# Patient Record
Sex: Female | Born: 1990 | Race: Black or African American | Hispanic: No | Marital: Single | Smoking: Former smoker
Health system: Southern US, Community
[De-identification: ages and names within clinical notes are randomized; demographics above are authoritative.]

## PROBLEM LIST (undated history)

## (undated) DIAGNOSIS — D573 Sickle-cell trait: Secondary | ICD-10-CM

## (undated) HISTORY — DX: Morbid (severe) obesity due to excess calories: E66.01

## (undated) HISTORY — DX: Sickle-cell trait: D57.3

---
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
## 2012-05-21 ENCOUNTER — Encounter: Payer: Medicaid Other | Admitting: Obstetrics and Gynecology

## 2012-06-18 ENCOUNTER — Encounter: Payer: Self-pay | Admitting: Obstetrics and Gynecology

## 2012-07-16 ENCOUNTER — Encounter: Payer: Self-pay | Admitting: Obstetrics and Gynecology

## 2012-08-08 ENCOUNTER — Ambulatory Visit (INDEPENDENT_AMBULATORY_CARE_PROVIDER_SITE_OTHER): Payer: Medicaid Other | Admitting: Obstetrics & Gynecology

## 2012-08-08 ENCOUNTER — Encounter: Payer: Self-pay | Admitting: Obstetrics & Gynecology

## 2012-08-08 VITALS — BP 124/88 | HR 86 | Temp 98.0°F | Ht 66.0 in | Wt 324.6 lb

## 2012-08-08 DIAGNOSIS — E282 Polycystic ovarian syndrome: Secondary | ICD-10-CM

## 2012-08-08 LAB — POCT PREGNANCY, URINE: Preg Test, Ur: NEGATIVE

## 2012-08-08 NOTE — Progress Notes (Signed)
  Subjective:    Patient ID: Kelsey Mathis, female    DOB: Feb 28, 1991, 22 y.o.   MRN: [id number]  HPI 22 yo S AA [id number] who is here today in the gyn clinic because of irregular bleeding, almost daily for the last month. She says that periods have always been irregular. Menarche at about 22 yo. In the year of 2013 she had no periods at all. [facility] and was given provera to induce a period. In 2013 she did take OCPs for about 3 months.    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 She is moving to [place]. She has been monogamous with her boyfriend for 4 years. She had 1 of the 3 Gardasils. She is not interested in restarting the Gardasil.  She is having unprotected sex and is ok with a pregnancy. Objective:   Physical Exam        Assessment & Plan:  Probable PCOS causing DUB/oligomenorrhea. I will get fasting lipid panel, cmeta, gyn u/s, test.. I have explained that weight loss will improve her general well being.

## 2012-08-15 ENCOUNTER — Ambulatory Visit (HOSPITAL_COMMUNITY)
Admission: RE | Admit: 2012-08-15 | Discharge: 2012-08-15 | Disposition: A | Discharge status: Home / Self Care | Payer: Self-pay | Source: Ambulatory Visit | Attending: Obstetrics & Gynecology | Admitting: Obstetrics & Gynecology

## 2012-08-15 DIAGNOSIS — N926 Irregular menstruation, unspecified: Secondary | ICD-10-CM | POA: Insufficient documentation

## 2012-08-15 DIAGNOSIS — N938 Other specified abnormal uterine and vaginal bleeding: Secondary | ICD-10-CM | POA: Insufficient documentation

## 2012-08-15 DIAGNOSIS — E282 Polycystic ovarian syndrome: Secondary | ICD-10-CM | POA: Insufficient documentation

## 2012-08-15 DIAGNOSIS — N949 Unspecified condition associated with female genital organs and menstrual cycle: Secondary | ICD-10-CM | POA: Insufficient documentation

## 2012-09-18 ENCOUNTER — Telehealth: Payer: Self-pay | Admitting: *Deleted

## 2012-09-18 NOTE — Telephone Encounter (Signed)
Pt left message requesting test results.  

## 2012-09-25 NOTE — Telephone Encounter (Signed)
Called pt and went straight to VM left message to return call to the clinics.

## 2012-09-26 ENCOUNTER — Telehealth: Payer: Self-pay | Admitting: *Deleted

## 2012-09-26 NOTE — Telephone Encounter (Signed)
Called patient on mobile, went straight to VM. Called patient at home number, no one answered- left message to call us back at the clinics

## 2012-09-26 NOTE — Telephone Encounter (Signed)
Kelsey Mathis called and left a message that was difficult to understand.  States she has been a patient for 3 months and needs results of old doctor visit because problem is still going on.

## 2012-09-26 NOTE — Telephone Encounter (Signed)
Called pt and pt informed me that she had been seen about a month ago and no one has told her anything.  Looking at pt's chart Dr. Marice Potter wanted her to get fasting lipid panel in which she did not get done and I advised her to call tomorrow to schedule a follow up appt and to either schedule lab draw and provider appt on same day or just lab draw then provider appt separate to make sure that she is fasting starting midnight the night before her appt for the lab.  Pt stated ok and did not have any other questions.

## 2012-09-28 NOTE — Telephone Encounter (Signed)
Reviewed chart and patient had recently been contacted inquiring about a lab visit for a fasting lipid panel and provider visit. Called patient to verify. Patient stated she wanted the lab appt and the provider visit to be the same day. Told patient I would give our front office staff her information and they will contact her sometime early next week to schedule an appt. Patient verbalized understanding and had no further questions

## 2012-10-02 ENCOUNTER — Encounter: Payer: Self-pay | Admitting: Obstetrics & Gynecology

## 2012-11-09 ENCOUNTER — Ambulatory Visit: Payer: Self-pay | Admitting: Obstetrics & Gynecology

## 2012-11-22 ENCOUNTER — Encounter: Payer: Self-pay | Admitting: Obstetrics & Gynecology

## 2012-11-22 ENCOUNTER — Ambulatory Visit (INDEPENDENT_AMBULATORY_CARE_PROVIDER_SITE_OTHER): Payer: Self-pay | Admitting: Obstetrics & Gynecology

## 2012-11-22 VITALS — BP 113/73 | HR 109 | Temp 98.5°F | Ht 66.75 in | Wt 327.4 lb

## 2012-11-22 DIAGNOSIS — E282 Polycystic ovarian syndrome: Secondary | ICD-10-CM

## 2012-11-22 MED ORDER — NORETHIN-ETH ESTRADIOL-FE 0.4-35 MG-MCG PO CHEW: 1.0000 | CHEWABLE_TABLET | Freq: Every day | ORAL | Status: AC

## 2012-11-22 MED ORDER — METFORMIN HCL 500 MG PO TABS: 500.0000 mg | ORAL_TABLET | Freq: Two times a day (BID) | ORAL | Status: AC

## 2012-11-22 NOTE — Patient Instructions (Addendum)
Polycystic Ovarian Syndrome  Polycystic ovarian syndrome is a condition with a number of problems. One problem is with the ovaries. The ovaries are organs located in the female pelvis, on each side of the uterus. Usually, during the menstrual cycle, an egg is released from 1 ovary every month. This is called ovulation. When the egg is fertilized, it goes into the womb (uterus), which allows for the growth of a baby. The egg travels from the ovary through the fallopian tube to the uterus. The ovaries also make the hormones estrogen and progesterone. These hormones help the development of a woman's breasts, body shape, and body hair. They also regulate the menstrual cycle and pregnancy.  Sometimes, cysts form in the ovaries. A cyst is a fluid-filled sac. On the ovary, different types of cysts can form. The most common type of ovarian cyst is called a functional or ovulation cyst. It is normal, and often forms during the normal menstrual cycle. Each month, a woman's ovaries grow tiny cysts that hold the eggs. When an egg is fully grown, the sac breaks open. This releases the egg. Then, the sac which released the egg from the ovary dissolves. In one type of functional cyst, called a follicle cyst, the sac does not break open to release the egg. It may actually continue to grow. This type of cyst usually disappears within 1 to 3 months.   One type of cyst problem with the ovaries is called Polycystic Ovarian Syndrome (PCOS). In this condition, many follicle cysts form, but do not rupture and produce an egg. This health problem can affect the following:  · Menstrual cycle.  · Heart.  · Obesity.  · Cancer of the uterus.  · Fertility.  · Blood vessels.  · Hair growth (face and body) or baldness.  · Hormones.  · Appearance.  · High blood pressure.  · Stroke.  · Insulin production.  · Inflammation of the liver.  · Elevated blood cholesterol and triglycerides.  CAUSES   · No one knows the exact cause of PCOS.  · Women with  PCOS often have a mother or sister with PCOS. There is not yet enough proof to say this is inherited.  · Many women with PCOS have a weight problem.  · Researchers are looking at the relationship between PCOS and the body's ability to make insulin. Insulin is a hormone that regulates the change of sugar, starches, and other food into energy for the body's use, or for storage. Some women with PCOS make too much insulin. It is possible that the ovaries react by making too many female hormones, called androgens. This can lead to acne, excessive hair growth, weight gain, and ovulation problems.  · Too much production of luteinizing hormone (LH) from the pituitary gland in the brain stimulates the ovary to produce too much female hormone (androgen).  SYMPTOMS   · Infrequent or no menstrual periods, and/or irregular bleeding.  · Inability to get pregnant (infertility), because of not ovulating.  · Increased growth of hair on the face, chest, stomach, back, thumbs, thighs, or toes.  · Acne, oily skin, or dandruff.  · Pelvic pain.  · Weight gain or obesity, usually carrying extra weight around the waist.  · Type 2 diabetes (this is the diabetes that usually does not need insulin).  · High cholesterol.  · High blood pressure.  · Female-pattern baldness or thinning hair.  · Patches of thickened and dark brown or black skin on the neck, arms, breasts,   or thighs.  · Skin tags, or tiny excess flaps of skin, in the armpits or neck area.  · Sleep apnea (excessive snoring and breathing stops at times while asleep).  · Deepening of the voice.  · Gestational diabetes when pregnant.  · Increased risk of miscarriage with pregnancy.  DIAGNOSIS   There is no single test to diagnose PCOS.   · Your caregiver will:  · Take a medical history.  · Perform a pelvic exam.  · Perform an ultrasound.  · Check your female and female hormone levels.  · Measure glucose or sugar levels in the blood.  · Do other blood tests.  · If you are producing too many  female hormones, your caregiver will make sure it is from PCOS. At the physical exam, your caregiver will want to evaluate the areas of increased hair growth. Try to allow natural hair growth for a few days before the visit.  · During a pelvic exam, the ovaries may be enlarged or swollen by the increased number of small cysts. This can be seen more easily by vaginal ultrasound or screening, to examine the ovaries and lining of the uterus (endometrium) for cysts. The uterine lining may become thicker, if there has not been a regular period.  TREATMENT   Because there is no cure for PCOS, it needs to be managed to prevent problems. Treatments are based on your symptoms. Treatment is also based on whether you want to have a baby or whether you need contraception.   Treatment may include:  · Progesterone hormone, to start a menstrual period.  · Birth control pills, to make you have regular menstrual periods.  · Medicines to make you ovulate, if you want to get pregnant.  · Medicines to control your insulin.  · Medicine to control your blood pressure.  · Medicine and diet, to control your high cholesterol and triglycerides in your blood.  · Surgery, making small holes in the ovary, to decrease the amount of female hormone production. This is done through a long, lighted tube (laparoscope), placed into the pelvis through a tiny incision in the lower abdomen.  Your caregiver will go over some of the choices with you.  WOMEN WITH PCOS HAVE THESE CHARACTERISTICS:  · High levels of female hormones called androgens.  · An irregular or no menstrual cycle.  · May have many small cysts in their ovaries.  PCOS is the most common hormonal reproductive problem in women of childbearing age.  WHY DO WOMEN WITH PCOS HAVE TROUBLE WITH THEIR MENSTRUAL CYCLE?  Each month, about 20 eggs start to mature in the ovaries. As one egg grows and matures, the follicle breaks open to release the egg, so it can travel through the fallopian tube for  fertilization. When the single egg leaves the follicle, ovulation takes place. In women with PCOS, the ovary does not make all of the hormones it needs for any of the eggs to fully mature. They may start to grow and accumulate fluid, but no one egg becomes large enough. Instead, some may remain as cysts. Since no egg matures or is released, ovulation does not occur and the hormone progesterone is not made. Without progesterone, a woman's menstrual cycle is irregular or absent. Also, the cysts produce female hormones, which continue to prevent ovulation.   Document Released: 06/17/2004 Document Revised: 05/16/2011 Document Reviewed: 01/09/2009  [employer]® Patient Information ©2014 [employer].

## 2012-11-22 NOTE — Progress Notes (Signed)
States was bleeding on and off for 6 months. Then bleeding everyday- sometimes heavy sometimes light for 3 months

## 2012-11-22 NOTE — Progress Notes (Signed)
Subjective:     Patient ID: Kelsey Mathis, female   DOB: Aug 19, 1990, 22 y.o.   MRN: [id number]  HPI  Pt reports that she has had irreg bleeding since menarche.  She has ceased bleeding now but, was just recently bleeding for 15days.  She did not f/u to get the labs that were ordered when she was here prev.  She reports that she is too busy to exercise.     Review of Systems     Objective:   Physical Exam BP 113/73  Pulse 109  Temp(Src) 98.5 F (36.9 C)  Ht 5' 6.75" (1.695 m)  Wt 327 lb 6.4 oz (148.508 kg)  BMI 51.69 kg/m2  LMP 08/22/2012 Pt in NAD  Exam deferred   08/15/2012 Clinical Data: Dysfunctional uterine bleeding. Irregular menses.  Polycystic ovary syndrome. Uncertain LMP.  TRANSABDOMINAL AND TRANSVAGINAL ULTRASOUND OF PELVIS  Technique: Both transabdominal and transvaginal ultrasound  examinations of the pelvis were performed. Transabdominal  technique was performed for global imaging of the pelvis including  uterus, ovaries, adnexal regions, and pelvic cul-de-sac.  It was necessary to proceed with endovaginal exam following the  transabdominal exam to visualize the endometrium and ovaries.  Comparison: None.  Findings:  Uterus: 5.6 x 3.1 x 3.4 cm. No fibroids or other uterine mass  identified.  Endometrium: Double layer thickness measures 10 mm transvaginally.  No focal lesion visualized, howeverthere are a few tiny scattered  cystic foci noted within the endometrium, which can be seen with  cystic hyperplasia.  Right ovary: 2.9 x 1.3 x 1.4 cm. Multiple small, less than 1 cm  follicles, without evidence of dominant follicle or corpus luteum.  No ovarian or adnexal mass identified.  Left ovary: 3.5 x 1.6 x 1.6 cm. Multiple small, less than 1 cm  follicles, without evidence of dominant follicle or corpus luteum.  No ovarian or adnexal mass identified.  Other Findings: No free fluid  IMPRESSION:  1. Numerous small bilateral ovarian follicles, without evidence  of  dominant follicle or corpus luteum. These findings meet  sonographic criteria for polycystic ovary syndrome; suggest  clinical correlation and biochemical testing for confirmation.  2. Endometrial thickness measures 10 mm, with scattered tiny  cystic foci which can be seen with cystic endometrial hyperplasia.  Consider endometrial biopsy if clinically warranted.  3. No pelvic mass or free fluid identified.       Assessment:     PCOS- educated pt on the ds and the increased risk of DM, CAD and HTN etc.      Plan:     Pt to f/u fasting for labs FemCon 1po q day Metformin 500mg  bid F/u 3 months rec exercise

## 2013-07-08 ENCOUNTER — Ambulatory Visit (INDEPENDENT_AMBULATORY_CARE_PROVIDER_SITE_OTHER): Payer: Medicaid Other | Admitting: Obstetrics & Gynecology

## 2013-07-08 ENCOUNTER — Encounter: Payer: Self-pay | Admitting: Obstetrics & Gynecology

## 2013-07-08 VITALS — BP 124/79 | HR 107 | Temp 98.1°F | Ht 67.0 in | Wt 329.7 lb

## 2013-07-08 DIAGNOSIS — E282 Polycystic ovarian syndrome: Secondary | ICD-10-CM

## 2013-07-08 MED ORDER — NORETHIN ACE-ETH ESTRAD-FE 1-20 MG-MCG(24) PO TABS: 1.0000 | ORAL_TABLET | Freq: Every day | ORAL | Status: AC

## 2013-07-08 NOTE — Progress Notes (Signed)
Subjective:     Patient ID: Kelsey Mathis, female   DOB: 01/03/1991, 23 y.o.   MRN: [id number]  [facility] presents from the [facility]. She was previously diagnosed with PCOS (although she reports  that no lab tests were preformed and she was told that it was 'presumed.'  She reports that since she has been exercising her menses have become more regular.   Review of Systems     Objective:   Physical Exam BP 124/79  Pulse 107  Temp(Src) 98.1 F (36.7 C)  Ht 5\' 7"  (1.702 m)  Wt 329 lb 11.2 oz (149.551 kg)  BMI 51.63 kg/m2  LMP 06/17/2013  Exam deferred       Assessment:     PCOS- previously told that she was prediabetic     Plan:     Labs: TSH, FSH. HbA1c sprintec F/u 3 months  Will probably need Glucophage   LoEstrin 1 po q day

## 2013-07-08 NOTE — Patient Instructions (Signed)
Polycystic Ovarian Syndrome Polycystic ovarian syndrome (PCOS) is a common hormonal disorder among women of reproductive age. Most women with PCOS grow many small cysts on their ovaries. PCOS can cause problems with your periods and make it difficult to get pregnant. It can also cause an increased risk of miscarriage with pregnancy. If left untreated, PCOS can lead to serious health problems, such as diabetes and heart disease. CAUSES The cause of PCOS is not fully understood, but genetics may be a factor. SIGNS AND SYMPTOMS   Infrequent or no menstrual periods.   Inability to get pregnant (infertility) because of not ovulating.   Increased growth of hair on the face, chest, stomach, back, thumbs, thighs, or toes.   Acne, oily skin, or dandruff.   Pelvic pain.   Weight gain or obesity, usually carrying extra weight around the waist.   Type 2 diabetes.   High cholesterol.   High blood pressure.   Female-pattern baldness or thinning hair.   Patches of thickened and dark brown or black skin on the neck, arms, breasts, or thighs.   Tiny excess flaps of skin (skin tags) in the armpits or neck area.   Excessive snoring and having breathing stop at times while asleep (sleep apnea).   Deepening of the voice.   Gestational diabetes when pregnant.  DIAGNOSIS  There is no single test to diagnose PCOS.   Your health care provider will:   Take a medical history.   Perform a pelvic exam.   Have ultrasonography done.   Check your female and female hormone levels.   Measure glucose or sugar levels in the blood.   Do other blood tests.   If you are producing too many female hormones, your health care provider will make sure it is from PCOS. At the physical exam, your health care provider will want to evaluate the areas of increased hair growth. Try to allow natural hair growth for a few days before the visit.   During a pelvic exam, the ovaries may be enlarged  or swollen because of the increased number of small cysts. This can be seen more easily by using vaginal ultrasonography or screening to examine the ovaries and lining of the uterus (endometrium) for cysts. The uterine lining may become thicker if you have not been having a regular period.  TREATMENT  Because there is no cure for PCOS, it needs to be managed to prevent problems. Treatments are based on your symptoms. Treatment is also based on whether you want to have a baby or whether you need contraception.  Treatment may include:   Progesterone hormone to start a menstrual period.   Birth control pills to make you have regular menstrual periods.   Medicines to make you ovulate, if you want to get pregnant.   Medicines to control your insulin.   Medicine to control your blood pressure.   Medicine and diet to control your high cholesterol and triglycerides in your blood.  Medicine to reduce excessive hair growth.  Surgery, making small holes in the ovary, to decrease the amount of female hormone production. This is done through a long, lighted tube (laparoscope) placed into the pelvis through a tiny incision in the lower abdomen.  HOME CARE INSTRUCTIONS  Only take over-the-counter or prescription medicine as directed by your health care provider.  Pay attention to the foods you eat and your activity levels. This can help reduce the effects of PCOS.  Keep your weight under control.  Eat foods that are   low in carbohydrate and high in fiber.  Exercise regularly. SEEK MEDICAL CARE IF:  Your symptoms do not get better with medicine.  You have new symptoms. Document Released: 06/17/2004 Document Revised: 12/12/2012 Document Reviewed: 08/09/2012 [employer] Patient Information 2014 [employer].  

## 2013-07-09 ENCOUNTER — Telehealth: Payer: Self-pay | Admitting: General Practice

## 2013-07-09 LAB — TSH: TSH: 1.197 u[IU]/mL (ref 0.350–4.500)

## 2013-07-09 LAB — HEMOGLOBIN A1C
Hgb A1c MFr Bld: 5.5 % (ref <5.7)
Mean Plasma Glucose: 111 mg/dL (ref <117)

## 2013-07-09 LAB — FOLLICLE STIMULATING HORMONE: FSH: 5 m[IU]/mL

## 2013-07-09 NOTE — Telephone Encounter (Signed)
Called patient, no answer- left message that we are trying to reach you, please give us a call back at the clinics

## 2013-07-09 NOTE — Telephone Encounter (Signed)
Patient called back and I informed her of results and recommendations. Patient verbalized understanding and had no further questions

## 2013-07-09 NOTE — Telephone Encounter (Signed)
Message copied by Kathee DeltonHILLMAN, CARRIE L on Tue Jul 09, 2013 10:49 AM ------      Message from: Willodean RosenthalHARRAWAY-SMITH, CAROLYN      Created: Tue Jul 09, 2013 10:33 AM       Please call pt.  All of her labs from yesterday were normal.  This still suggests PCOS (abnormal labs would suggest ANOTHER diagnosis).            She should f/u as scheduled.            Reinforce the need for exercise.            Thx,      clh-S        ------

## 2013-07-20 ENCOUNTER — Encounter: Payer: Self-pay | Admitting: *Deleted

## 2013-08-15 IMAGING — US US TRANSVAGINAL NON-OB
1 series · 13 of 25 positions shown · non-contrast
Comparison: None.

CLINICAL DATA: Dysfunctional uterine bleeding.  Irregular menses.
Polycystic ovary syndrome.  Uncertain LMP.



[Series 1: us pelvis complete · 13 of 45 slices shown]
[im 1/45]
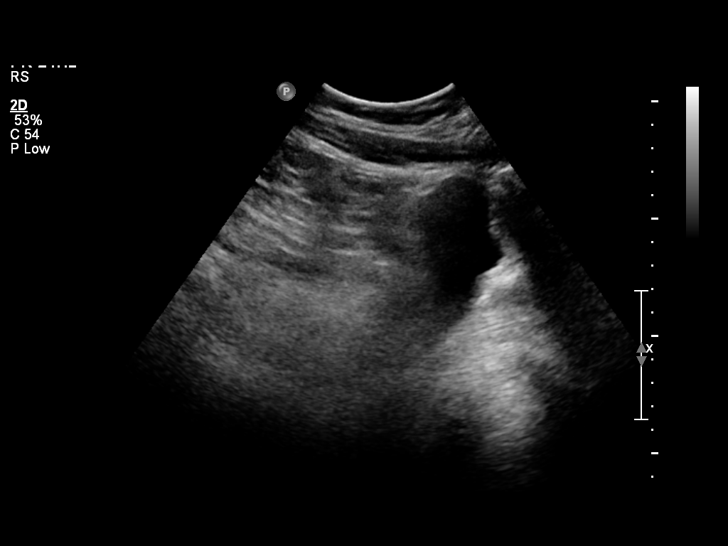
[im 4/45]
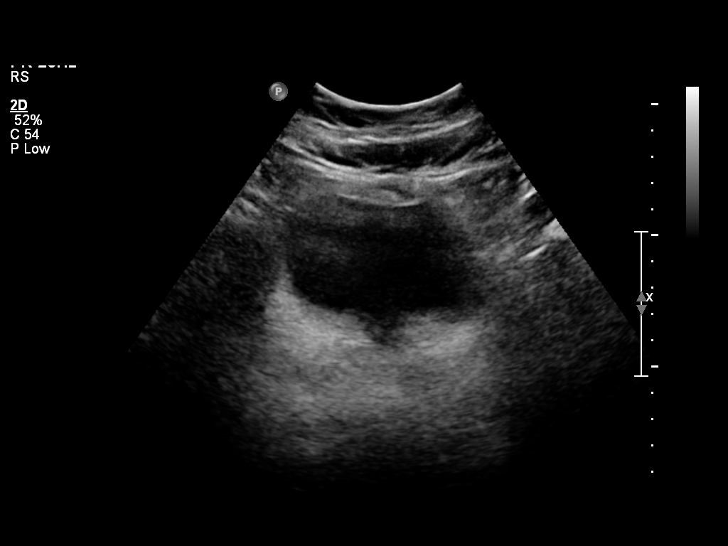
[im 8/45]
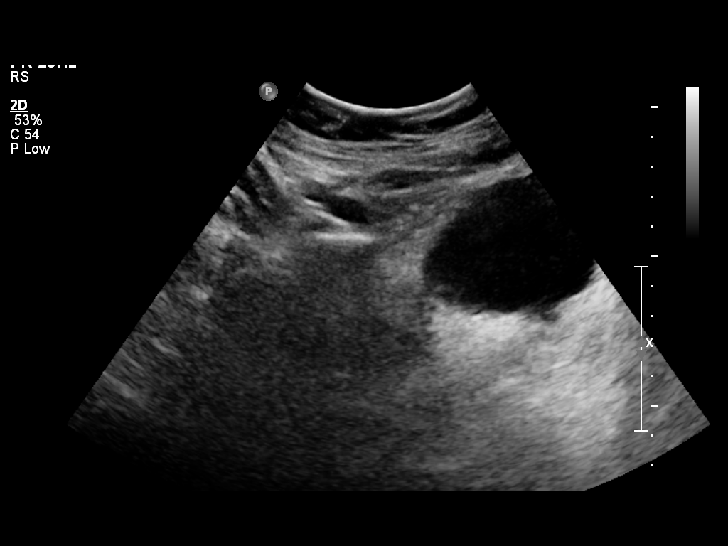
[im 12/45]
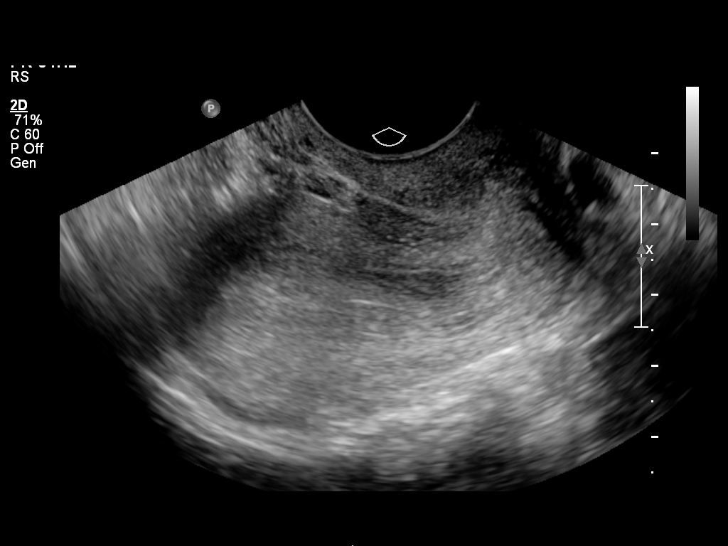
[im 15/45]
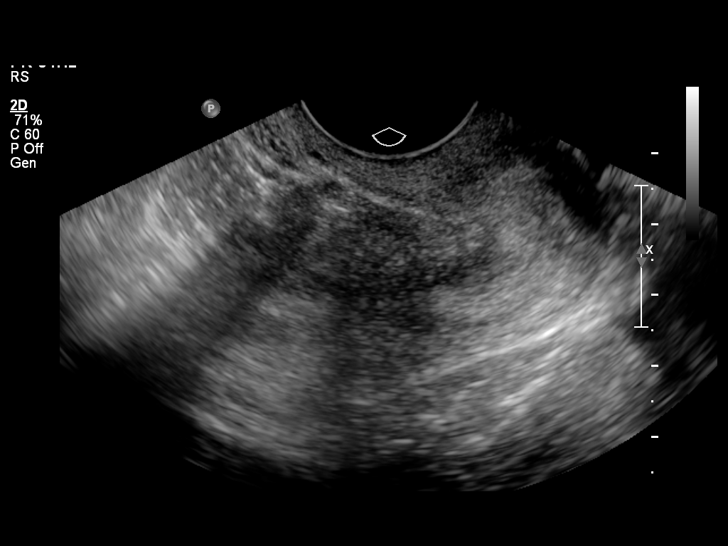
[im 19/45]
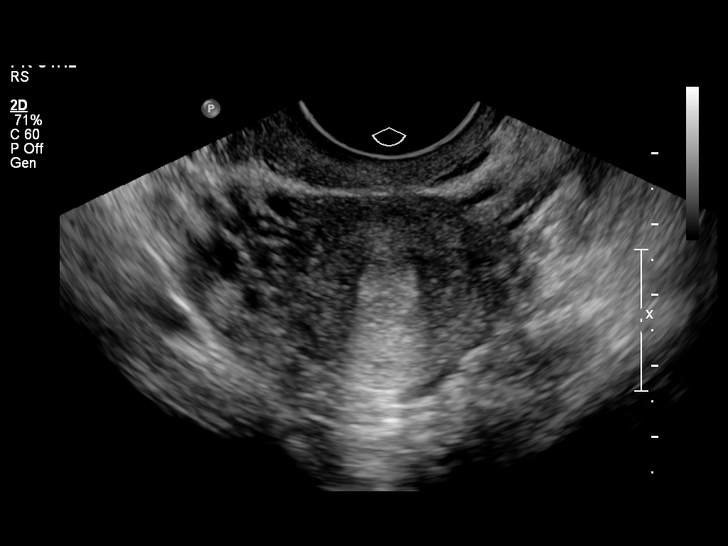
[im 23/45]
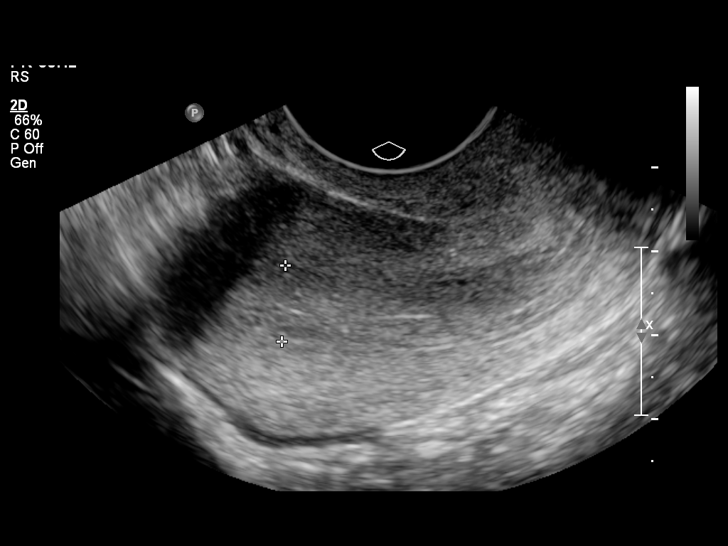
[im 26/45]
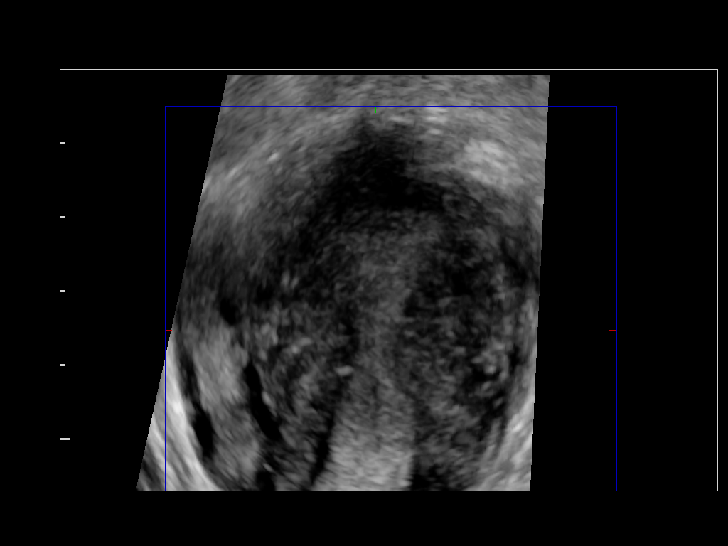
[im 30/45]
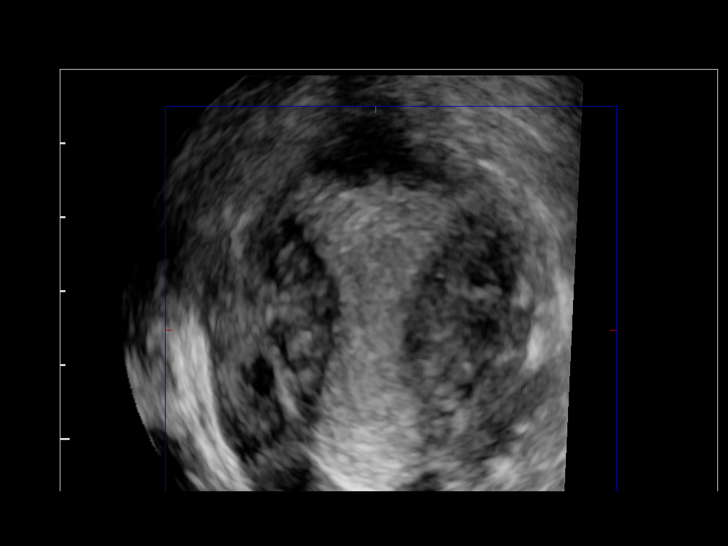
[im 34/45]
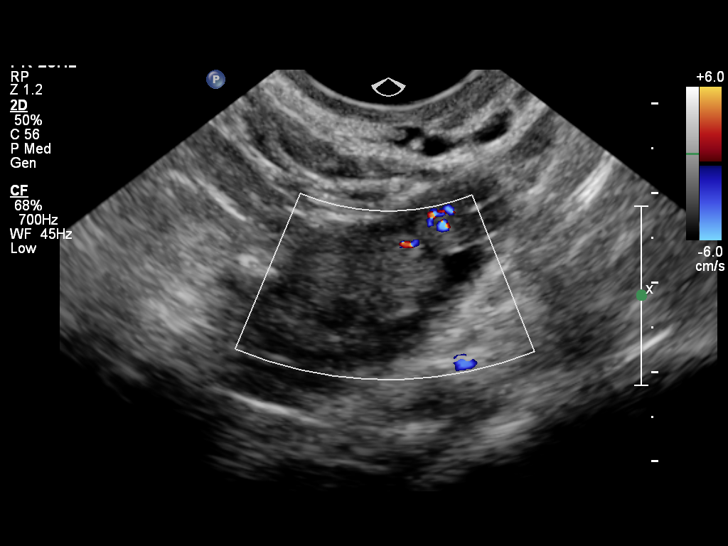
[im 37/45]
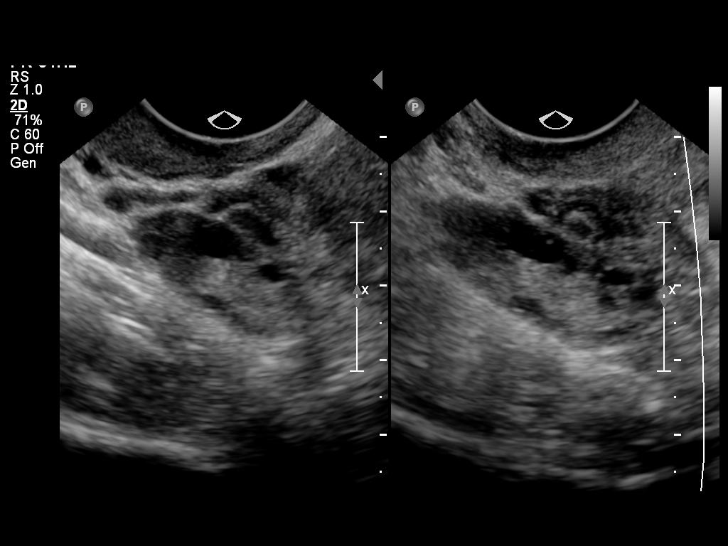
[im 41/45]
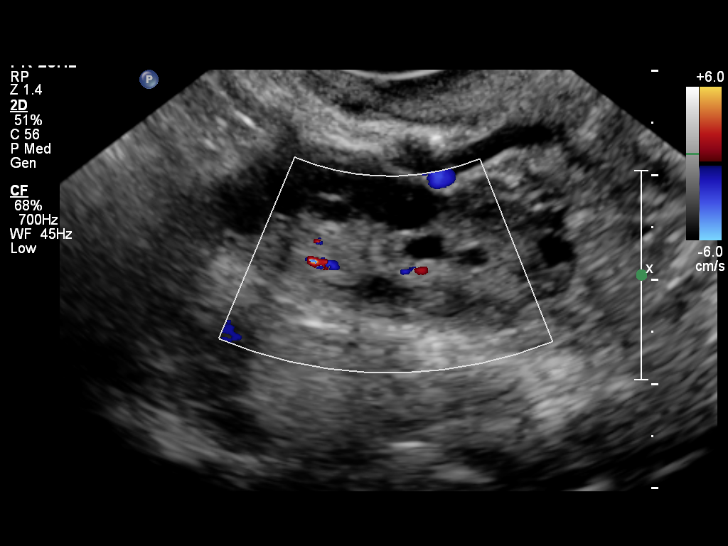
[im 45/45]
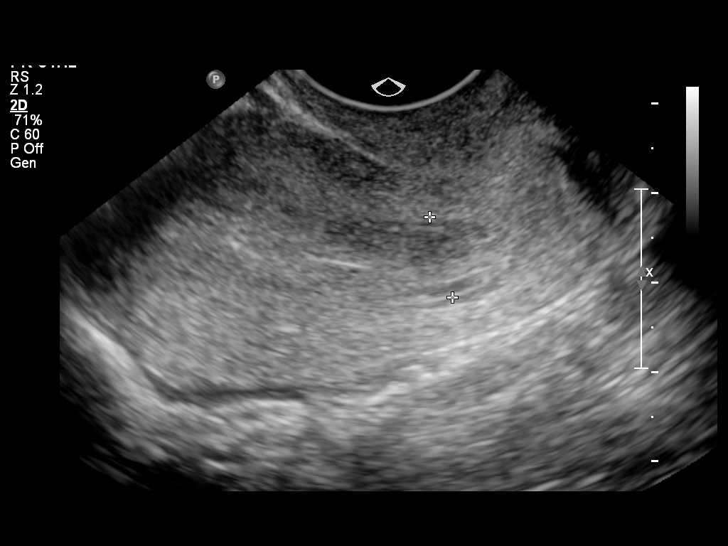

[13 of 25 positions shown; findings below may reference images not displayed]

FINDINGS: Uterus:  5.6 x 3.1 x 3.4 cm.  No fibroids or other uterine mass
identified.

Endometrium: Double layer thickness measures 10 mm transvaginally.
No focal lesion visualized, howeverthere are a few tiny scattered
cystic foci noted within the endometrium, which can be seen with
cystic hyperplasia.

Right ovary: 2.9 x 1.3 x 1.4 cm.  Multiple small, less than 1 cm
follicles, without evidence of dominant follicle or corpus luteum.
No ovarian or adnexal mass identified.

Left ovary: 3.5 x 1.6 x 1.6 cm. Multiple small, less than 1 cm
follicles, without evidence of dominant follicle or corpus luteum.
No ovarian or adnexal mass identified.

Other Findings:  No free fluid
IMPRESSION: 1.  Numerous small bilateral ovarian follicles, without evidence of
dominant follicle or corpus luteum.  These findings meet
sonographic criteria for polycystic ovary syndrome;  suggest
clinical correlation and biochemical testing for confirmation.
2.  Endometrial thickness measures 10 mm, with scattered tiny
cystic foci which can be seen with cystic endometrial hyperplasia.
Consider endometrial biopsy if clinically warranted.
3.  No pelvic mass or free fluid identified.

## 2013-08-16 ENCOUNTER — Telehealth: Payer: Self-pay | Admitting: *Deleted

## 2013-08-16 NOTE — Telephone Encounter (Signed)
Pt called nurse line to discuss metformin and side effects.  Discussed with patient that patient will need to increase small frequent into your daily diet to avoid low blood sugar.  Pt verbalizes understanding.

## 2013-09-23 ENCOUNTER — Encounter: Payer: Self-pay | Admitting: General Practice

## 2013-09-23 ENCOUNTER — Telehealth: Payer: Self-pay | Admitting: General Practice

## 2013-09-23 ENCOUNTER — Ambulatory Visit: Payer: Medicaid Other | Admitting: Obstetrics & Gynecology

## 2013-09-23 NOTE — Telephone Encounter (Signed)
Patient no showed for appt today. Called patient, no answer- left message that we are calling because it looks like you missed an appt with us today, please call our front office staff to reschedule this appt. Will send letter

## 2014-01-06 ENCOUNTER — Encounter: Payer: Self-pay | Admitting: Obstetrics & Gynecology
